# Patient Record
Sex: Male | Born: 1960 | Race: Black or African American | Hispanic: No | Marital: Single | Smoking: Former smoker
Health system: Southern US, Community
[De-identification: ages and names within clinical notes are randomized; demographics above are authoritative.]

## PROBLEM LIST (undated history)

## (undated) DIAGNOSIS — K219 Gastro-esophageal reflux disease without esophagitis: Secondary | ICD-10-CM

## (undated) DIAGNOSIS — I10 Essential (primary) hypertension: Secondary | ICD-10-CM

## (undated) DIAGNOSIS — E7800 Pure hypercholesterolemia, unspecified: Secondary | ICD-10-CM

---

## 2013-09-16 ENCOUNTER — Emergency Department (HOSPITAL_COMMUNITY): Payer: 59

## 2013-09-16 ENCOUNTER — Encounter (HOSPITAL_COMMUNITY): Payer: Self-pay | Admitting: Emergency Medicine

## 2013-09-16 ENCOUNTER — Emergency Department (HOSPITAL_COMMUNITY)
Admission: EM | Admit: 2013-09-16 | Discharge: 2013-09-16 | Disposition: A | Discharge status: Home / Self Care | Payer: 59 | Attending: Emergency Medicine | Admitting: Emergency Medicine

## 2013-09-16 DIAGNOSIS — Z8639 Personal history of other endocrine, nutritional and metabolic disease: Secondary | ICD-10-CM | POA: Insufficient documentation

## 2013-09-16 DIAGNOSIS — R42 Dizziness and giddiness: Secondary | ICD-10-CM | POA: Insufficient documentation

## 2013-09-16 DIAGNOSIS — Z87891 Personal history of nicotine dependence: Secondary | ICD-10-CM | POA: Insufficient documentation

## 2013-09-16 DIAGNOSIS — R209 Unspecified disturbances of skin sensation: Secondary | ICD-10-CM | POA: Insufficient documentation

## 2013-09-16 DIAGNOSIS — I10 Essential (primary) hypertension: Secondary | ICD-10-CM | POA: Insufficient documentation

## 2013-09-16 DIAGNOSIS — F411 Generalized anxiety disorder: Secondary | ICD-10-CM | POA: Insufficient documentation

## 2013-09-16 DIAGNOSIS — K219 Gastro-esophageal reflux disease without esophagitis: Secondary | ICD-10-CM | POA: Insufficient documentation

## 2013-09-16 DIAGNOSIS — Z79899 Other long term (current) drug therapy: Secondary | ICD-10-CM | POA: Insufficient documentation

## 2013-09-16 DIAGNOSIS — Z862 Personal history of diseases of the blood and blood-forming organs and certain disorders involving the immune mechanism: Secondary | ICD-10-CM | POA: Insufficient documentation

## 2013-09-16 DIAGNOSIS — R079 Chest pain, unspecified: Secondary | ICD-10-CM | POA: Insufficient documentation

## 2013-09-16 HISTORY — DX: Essential (primary) hypertension: I10

## 2013-09-16 HISTORY — DX: Pure hypercholesterolemia, unspecified: E78.00

## 2013-09-16 HISTORY — DX: Gastro-esophageal reflux disease without esophagitis: K21.9

## 2013-09-16 LAB — COMPREHENSIVE METABOLIC PANEL
ALBUMIN: 3.8 g/dL (ref 3.5–5.2)
ALT: 19 U/L (ref 0–53)
AST: 18 U/L (ref 0–37)
Alkaline Phosphatase: 76 U/L (ref 39–117)
BUN: 15 mg/dL (ref 6–23)
CO2: 22 mEq/L (ref 19–32)
CREATININE: 1.11 mg/dL (ref 0.50–1.35)
Calcium: 9.4 mg/dL (ref 8.4–10.5)
Chloride: 104 mEq/L (ref 96–112)
GFR calc Af Amer: 85 mL/min — ABNORMAL LOW (ref >90)
GFR calc non Af Amer: 74 mL/min — ABNORMAL LOW (ref >90)
Glucose, Bld: 107 mg/dL — ABNORMAL HIGH (ref 70–99)
Potassium: 4.3 mEq/L (ref 3.7–5.3)
Sodium: 139 mEq/L (ref 137–147)
TOTAL PROTEIN: 7 g/dL (ref 6.0–8.3)
Total Bilirubin: 0.4 mg/dL (ref 0.3–1.2)

## 2013-09-16 LAB — I-STAT TROPONIN, ED
TROPONIN I, POC: 0 ng/mL (ref 0.00–0.08)
TROPONIN I, POC: 0 ng/mL (ref 0.00–0.08)

## 2013-09-16 LAB — DIFFERENTIAL
BASOS ABS: 0 10*3/uL (ref 0.0–0.1)
BASOS PCT: 0 % (ref 0–1)
Eosinophils Absolute: 0.2 10*3/uL (ref 0.0–0.7)
Eosinophils Relative: 2 % (ref 0–5)
Lymphocytes Relative: 24 % (ref 12–46)
Lymphs Abs: 1.5 10*3/uL (ref 0.7–4.0)
MONO ABS: 0.9 10*3/uL (ref 0.1–1.0)
Monocytes Relative: 14 % — ABNORMAL HIGH (ref 3–12)
NEUTROS ABS: 3.8 10*3/uL (ref 1.7–7.7)
NEUTROS PCT: 60 % (ref 43–77)

## 2013-09-16 LAB — CBC
HCT: 42.9 % (ref 39.0–52.0)
HEMOGLOBIN: 14.8 g/dL (ref 13.0–17.0)
MCH: 29.4 pg (ref 26.0–34.0)
MCHC: 34.5 g/dL (ref 30.0–36.0)
MCV: 85.3 fL (ref 78.0–100.0)
Platelets: 265 10*3/uL (ref 150–400)
RBC: 5.03 MIL/uL (ref 4.22–5.81)
RDW: 12.9 % (ref 11.5–15.5)
WBC: 6.3 10*3/uL (ref 4.0–10.5)

## 2013-09-16 NOTE — ED Notes (Signed)
Reports having episodes of dizziness intermittent over the past week. Started with episodes of numbness to his left side of face and now will get intermittent numbness all the way down his left side. No neuro deficits noted at this time, denies dizziness but does feel mildly lightheaded. ekg done at triage, no neuro deficits noted at triage.

## 2013-09-16 NOTE — ED Notes (Signed)
PA at bedside.

## 2013-09-16 NOTE — Discharge Instructions (Signed)

## 2013-09-16 NOTE — ED Provider Notes (Signed)
CSN: [id number]     Arrival date & time 09/16/13  1019 History   First MD Initiated Contact with Patient 09/16/13 1248     Chief Complaint  Patient presents with  . Dizziness  . Numbness     (Consider location/radiation/quality/duration/timing/severity/associated sxs/prior Treatment) Patient is a 53 y.o. male presenting with dizziness. The history is provided by the patient.  Dizziness Quality:  Lightheadedness and imbalance Severity:  Moderate Onset quality:  Gradual Duration: intermittent dizziness for 2 weeks, associated with tingling to left face and body for one week. Timing:  Intermittent Progression:  Waxing and waning Chronicity:  New Context comment:  Stopped amlodipine 3 months ago.  Continuing atenolol and losartan. Relieved by: Rest. Worsened by:  Nothing tried Ineffective treatments:  None tried Associated symptoms: no blood in stool, no chest pain, no diarrhea, no headaches, no hearing loss, no nausea, no palpitations, no shortness of breath, no syncope, no tinnitus, no vision changes, no vomiting and no weakness   Associated symptoms comment:  Patient reports increased stress at work. Risk factors: no hx of stroke, no hx of vertigo and no new medications     Patient reports intermittent dizziness and left sided tingling.  Patient states the dizziness began 2 weeks ago and last week he began having left-sided tingling to his face and body that was associated with the dizziness. Patient states he had a similar episode earlier this week and began yesterday that resolved with rest. Today, patient states he was at work this morning developed similar symptoms with associated chest tightness. He states his symptoms this morning lasted approximately 30 minutes and resolved when he sat down. Patient was seen this morning by his company nurse and was advised to come to the emergency department for evaluation. Patient states that he has seen his primary care physician for the  dizziness at onset and was told that it may be related to sinus infections. She reports that his blood pressure has been elevated in the past, but the has been on 3 different medications, but stopped amlodipine 3 months ago do to making him feel "weak".   He denies headache, chest pain, shortness of breath, vomiting, or any symptoms at present.  Past Medical History  Diagnosis Date  . Hypertension   . Acid reflux   . High cholesterol    History reviewed. No pertinent past surgical history. History reviewed. No pertinent family history. History  Substance Use Topics  . Smoking status: Former Games developer  . Smokeless tobacco: Not on file  . Alcohol Use: Yes     Comment: occ    Review of Systems  Constitutional: Negative for activity change and appetite change.  HENT: Negative for hearing loss, tinnitus, trouble swallowing and voice change.   Eyes: Negative for photophobia, pain and visual disturbance.  Respiratory: Negative for shortness of breath.   Cardiovascular: Negative for chest pain, palpitations and syncope.  Gastrointestinal: Negative for nausea, vomiting, diarrhea and blood in stool.  Genitourinary: Negative for dysuria and frequency.  Musculoskeletal: Negative for arthralgias, neck pain and neck stiffness.  Skin: Negative for rash.  Neurological: Positive for dizziness, light-headedness and numbness. Negative for seizures, syncope, facial asymmetry, speech difficulty, weakness and headaches.  Hematological: Negative for adenopathy.  Psychiatric/Behavioral: Negative for confusion. The patient is nervous/anxious.   All other systems reviewed and are negative.     Allergies  Review of patient's allergies indicates no known allergies.  Home Medications   Prior to Admission medications   Medication Sig Start  Date End Date Taking? Authorizing Provider  allopurinol (ZYLOPRIM) 300 MG tablet Take 300 mg by mouth daily.  09/08/13  Yes Historical Provider, MD  atenolol (TENORMIN)  50 MG tablet Take 50 mg by mouth daily.  09/08/13  Yes Historical Provider, MD  fexofenadine (ALLEGRA) 180 MG tablet Take 180 mg by mouth daily.   Yes Historical Provider, MD  fluticasone (FLONASE) 50 MCG/ACT nasal spray Place 2 sprays into both nostrils daily as needed for allergies.   Yes Historical Provider, MD  losartan (COZAAR) 100 MG tablet Take 100 mg by mouth daily.  09/08/13  Yes Historical Provider, MD  RABEprazole (ACIPHEX) 20 MG tablet Take 20 mg by mouth daily.  09/08/13  Yes Historical Provider, MD   BP 182/93  Pulse 56  Temp(Src) 98.3 F (36.8 C) (Oral)  Resp 18  Ht 6\' 2"  (1.88 m)  Wt 215 lb (97.523 kg)  BMI 27.59 kg/m2  SpO2 95% Physical Exam  Nursing note and vitals reviewed. Constitutional: He is oriented to person, place, and time. He appears well-developed and well-nourished. No distress.  HENT:  Head: Normocephalic and atraumatic.  Mouth/Throat: Oropharynx is clear and moist.  Eyes: EOM are normal. Pupils are equal, round, and reactive to light.  Neck: Normal range of motion and phonation normal. Neck supple. No spinous process tenderness and no muscular tenderness present. No rigidity. No Brudzinski's sign and no Kernig's sign noted.  Cardiovascular: Normal rate, regular rhythm, normal heart sounds and intact distal pulses.   No murmur heard. Pulmonary/Chest: Effort normal and breath sounds normal. No respiratory distress. He exhibits no tenderness.  Abdominal: Soft. Bowel sounds are normal. He exhibits no distension. There is no tenderness. There is no rebound and no guarding.  Musculoskeletal: Normal range of motion.  Neurological: He is alert and oriented to person, place, and time. He has normal strength. He displays no tremor. No cranial nerve deficit or sensory deficit. He exhibits normal muscle tone. Coordination and gait normal. GCS eye subscore is 4. GCS verbal subscore is 5. GCS motor subscore is 6.  Reflex Scores:      Tricep reflexes are 2+ on the right  side and 2+ on the left side.      Bicep reflexes are 2+ on the right side and 2+ on the left side.      Patellar reflexes are 2+ on the right side and 2+ on the left side.      Achilles reflexes are 2+ on the right side and 2+ on the left side. Skin: Skin is warm and dry.  Psychiatric: He has a normal mood and affect.    ED Course  Procedures (including critical care time) Labs Review Labs Reviewed  DIFFERENTIAL - Abnormal; Notable for the following:    Monocytes Relative 14 (*)    All other components within normal limits  COMPREHENSIVE METABOLIC PANEL - Abnormal; Notable for the following:    Glucose, Bld 107 (*)    GFR calc non Af Amer 74 (*)    GFR calc Af Amer 85 (*)    All other components within normal limits  CBC  I-STAT TROPOININ, ED  Rosezena Sensor, ED    Imaging Review Dg Chest 2 View  09/16/2013   CLINICAL DATA:  Left chest pain.  EXAM: CHEST  2 VIEW  COMPARISON:  None.  FINDINGS: Heart size and mediastinal contours are within normal limits. Both lungs are clear. Visualized skeletal structures are unremarkable.  IMPRESSION: Negative exam.   Electronically Signed  By: Drusilla Kannerhomas  Dalessio M.D.   On: 09/16/2013 14:19   Ct Head Wo Contrast  09/16/2013   CLINICAL DATA:  Intermittent dizziness  EXAM: CT HEAD WITHOUT CONTRAST  TECHNIQUE: Contiguous axial images were obtained from the base of the skull through the vertex without intravenous contrast.  COMPARISON:  None.  FINDINGS: There is no evidence of mass effect, midline shift or extra-axial fluid collections. There is no evidence of a space-occupying lesion or intracranial hemorrhage. There is no evidence of a cortical-based area of acute infarction.  The ventricles and sulci are appropriate for the patient's age. The basal cisterns are patent.  Visualized portions of the orbits are unremarkable. Small mucous retention cyst in the right maxillary sinus.  The osseous structures are unremarkable.  IMPRESSION: Normal CT of the  brain without intravenous contrast.   Electronically Signed   By: Elige KoHetal  Patel   On: 09/16/2013 14:29     EKG Interpretation   Date/Time:  Wednesday September 16 2013 10:25:37 EDT Ventricular Rate:  56 PR Interval:  156 QRS Duration: 78 QT Interval:  402 QTC Calculation: 387 R Axis:   89 Text Interpretation:  Sinus bradycardia Septal infarct , age undetermined  Abnormal ECG No previous ECGs available Confirmed by ZACKOWSKI  MD, SCOTT  [phone]) on 09/16/2013 1:25:13 PM      MDM   Final diagnoses:  Dizziness, nonspecific     Labs and imaging have been reviewed by me. Patient was history, labs, imaging, and care plan was discussed with Dr. Deretha EmoryZackowski.  Patient remained stable and asymptomatic. No evidence of acute neurological process at this time. Patient's orthostatic vital signs were within normal limits. Patient does report that symptoms only seem to occur while he is at work, it is believed that some of the symptoms may be stress related although I am concerned that the patient's blood pressure seems to be fluctuating consistently despite regular medications. Patient agrees to take his medications daily as directed and to call his primary doctor to arrange a followup appointment. Patient seemed stable for discharge at this time, he was advised to return to the ER for any worsening symptoms.   Kierstan Auer L. Trisha Mangleriplett, PA-C 09/17/13 2137

## 2013-09-16 NOTE — ED Notes (Signed)
Phlebotomy made aware patient has returned from CT. Amber verbalized understanding.

## 2013-09-20 NOTE — ED Provider Notes (Signed)
Medical screening examination/treatment/procedure(s) were performed by non-physician practitioner and as supervising physician I was immediately available for consultation/collaboration.   EKG Interpretation   Date/Time:  Wednesday September 16 2013 10:25:37 EDT Ventricular Rate:  56 PR Interval:  156 QRS Duration: 78 QT Interval:  402 QTC Calculation: 387 R Axis:   89 Text Interpretation:  Sinus bradycardia Septal infarct , age undetermined  Abnormal ECG No previous ECGs available Confirmed by Abdiaziz Klahn  MD, Dominick Morella  [phone]) on 09/16/2013 1:25:13 PM        Vanetta Mulders, MD 09/20/13 1144

## 2015-04-06 ENCOUNTER — Encounter (HOSPITAL_COMMUNITY): Payer: Self-pay | Admitting: *Deleted

## 2015-04-06 ENCOUNTER — Emergency Department (HOSPITAL_COMMUNITY)
Admission: EM | Admit: 2015-04-06 | Discharge: 2015-04-06 | Disposition: A | Discharge status: Home / Self Care | Payer: Self-pay | Attending: Emergency Medicine | Admitting: Emergency Medicine

## 2015-04-06 DIAGNOSIS — Z79899 Other long term (current) drug therapy: Secondary | ICD-10-CM | POA: Insufficient documentation

## 2015-04-06 DIAGNOSIS — R630 Anorexia: Secondary | ICD-10-CM | POA: Insufficient documentation

## 2015-04-06 DIAGNOSIS — R55 Syncope and collapse: Secondary | ICD-10-CM | POA: Insufficient documentation

## 2015-04-06 DIAGNOSIS — K219 Gastro-esophageal reflux disease without esophagitis: Secondary | ICD-10-CM | POA: Insufficient documentation

## 2015-04-06 DIAGNOSIS — I10 Essential (primary) hypertension: Secondary | ICD-10-CM | POA: Insufficient documentation

## 2015-04-06 DIAGNOSIS — Z8639 Personal history of other endocrine, nutritional and metabolic disease: Secondary | ICD-10-CM | POA: Insufficient documentation

## 2015-04-06 DIAGNOSIS — Z87891 Personal history of nicotine dependence: Secondary | ICD-10-CM | POA: Insufficient documentation

## 2015-04-06 LAB — CBC
HCT: 43.6 % (ref 39.0–52.0)
Hemoglobin: 14.5 g/dL (ref 13.0–17.0)
MCH: 29.8 pg (ref 26.0–34.0)
MCHC: 33.3 g/dL (ref 30.0–36.0)
MCV: 89.7 fL (ref 78.0–100.0)
Platelets: 247 10*3/uL (ref 150–400)
RBC: 4.86 MIL/uL (ref 4.22–5.81)
RDW: 12.7 % (ref 11.5–15.5)
WBC: 11.9 10*3/uL — ABNORMAL HIGH (ref 4.0–10.5)

## 2015-04-06 LAB — BASIC METABOLIC PANEL
ANION GAP: 8 (ref 5–15)
BUN: 10 mg/dL (ref 6–20)
CO2: 28 mmol/L (ref 22–32)
Calcium: 8.9 mg/dL (ref 8.9–10.3)
Chloride: 101 mmol/L (ref 101–111)
Creatinine, Ser: 1.3 mg/dL — ABNORMAL HIGH (ref 0.61–1.24)
GFR calc Af Amer: >60 mL/min (ref >60)
GFR calc non Af Amer: 60 mL/min — ABNORMAL LOW (ref >60)
GLUCOSE: 111 mg/dL — AB (ref 65–99)
Potassium: 4.2 mmol/L (ref 3.5–5.1)
SODIUM: 137 mmol/L (ref 135–145)

## 2015-04-06 LAB — CBG MONITORING, ED: Glucose-Capillary: 101 mg/dL — ABNORMAL HIGH (ref 65–99)

## 2015-04-06 NOTE — ED Notes (Signed)
Pt arrives via [facility], had a syncopal episode while getting his hair cut. LOC for about 1 min. Upon EMS arrival pt A/O x4 but c/o of dizziness. Pt denies N/V but states that he has had diarrhea x 1 day 2 days ago and has suffered from upper respiratory infection for the past few days and took some Alka Seltzer this AM.

## 2015-04-06 NOTE — Discharge Instructions (Signed)
Take tylenol 2 pills 4 times a day and motrin 4 pills 3 times a day.  Drink plenty of fluids.  Return for worsening shortness of breath, headache, confusion. Follow up with your family doctor.   Syncope Syncope is a medical term for fainting or passing out. This means you lose consciousness and drop to the ground. People are generally unconscious for less than 5 minutes. You may have some muscle twitches for up to 15 seconds before waking up and returning to normal. Syncope occurs more often in older adults, but it can happen to anyone. While most causes of syncope are not dangerous, syncope can be a sign of a serious medical problem. It is important to seek medical care.  CAUSES  Syncope is caused by a sudden drop in blood flow to the brain. The specific cause is often not determined. Factors that can bring on syncope include:  Taking medicines that lower blood pressure.  Sudden changes in posture, such as standing up quickly.  Taking more medicine than prescribed.  Standing in one place for too long.  Seizure disorders.  Dehydration and excessive exposure to heat.  Low blood sugar (hypoglycemia).  Straining to have a bowel movement.  Heart disease, irregular heartbeat, or other circulatory problems.  Fear, emotional distress, seeing blood, or severe pain. SYMPTOMS  Right before fainting, you may:  Feel dizzy or light-headed.  Feel nauseous.  See all white or all black in your field of vision.  Have cold, clammy skin. DIAGNOSIS  Your health care provider will ask about your symptoms, perform a physical exam, and perform an electrocardiogram (ECG) to record the electrical activity of your heart. Your health care provider may also perform other heart or blood tests to determine the cause of your syncope which may include:  Transthoracic echocardiogram (TTE). During echocardiography, sound waves are used to evaluate how blood flows through your heart.  Transesophageal  echocardiogram (TEE).  Cardiac monitoring. This allows your health care provider to monitor your heart rate and rhythm in real time.  Holter monitor. This is a portable device that records your heartbeat and can help diagnose heart arrhythmias. It allows your health care provider to track your heart activity for several days, if needed.  Stress tests by exercise or by giving medicine that makes the heart beat faster. TREATMENT  In most cases, no treatment is needed. Depending on the cause of your syncope, your health care provider may recommend changing or stopping some of your medicines. HOME CARE INSTRUCTIONS  Have someone stay with you until you feel stable.  Do not drive, use machinery, or play sports until your health care provider says it is okay.  Keep all follow-up appointments as directed by your health care provider.  Lie down right away if you start feeling like you might faint. Breathe deeply and steadily. Wait until all the symptoms have passed.  Drink enough fluids to keep your urine clear or pale yellow.  If you are taking blood pressure or heart medicine, get up slowly and take several minutes to sit and then stand. This can reduce dizziness. SEEK IMMEDIATE MEDICAL CARE IF:   You have a severe headache.  You have unusual pain in the chest, abdomen, or back.  You are bleeding from your mouth or rectum, or you have black or tarry stool.  You have an irregular or very fast heartbeat.  You have pain with breathing.  You have repeated fainting or seizure-like jerking during an episode.  You faint  when sitting or lying down.  You have confusion.  You have trouble walking.  You have severe weakness.  You have vision problems. If you fainted, call your local emergency services (911 in [place] Do not drive yourself to the hospital.    This information is not intended to replace advice given to you by your health care provider. Make sure you discuss any questions  you have with your health care provider.   Document Released: 04/02/2005 Document Revised: 08/17/2014 Document Reviewed: 06/01/2011 Elsevier Interactive Patient Education [employer]

## 2015-04-06 NOTE — ED Notes (Signed)
Pt denies hitting his head during syncopal episode.

## 2015-04-06 NOTE — ED Provider Notes (Signed)
CSN: [id number]     Arrival date & time 04/06/15  1139 History   First MD Initiated Contact with Patient 04/06/15 1156     Chief Complaint  Patient presents with  . Loss of Consciousness     (Consider location/radiation/quality/duration/timing/severity/associated sxs/prior Treatment) Patient is a 54 y.o. male presenting with syncope. The history is provided by the patient.  Loss of Consciousness Episode history:  Single Most recent episode:  Today Duration:  1 minute Timing:  Rare Progression:  Resolved Chronicity:  New Context comment:  Hair cut Witnessed: yes   Relieved by:  Nothing Worsened by:  Nothing tried Ineffective treatments:  None tried Associated symptoms: no anxiety, no chest pain, no confusion, no diaphoresis, no difficulty breathing, no dizziness, no fever, no headaches, no nausea, no palpitations, no shortness of breath and no vomiting   Risk factors: no congenital heart disease and no coronary artery disease     54 year old male with a chief complaint of a syncopal episode. This happened while he is getting his haircut earlier today. Patient thinks it lasted for about a minute. CT felt like the world was closing in on him and then he passed out. Denies chest pain shortness of breath headache neck pain. Patient has had a URI for the past 3 or 4 days and has had decreased oral intake. Patient had minimal to eat or drink today. Denies fevers or chills denies shortness of breath. Denies family history of sudden cardiac death denies history of heart failure. Denies lower extremity swelling.  Past Medical History  Diagnosis Date  . Hypertension   . Acid reflux   . High cholesterol    History reviewed. No pertinent past surgical history. No family history on file. Social History  Substance Use Topics  . Smoking status: Former Games developer  . Smokeless tobacco: None  . Alcohol Use: Yes     Comment: occ    Review of Systems  Constitutional: Negative for fever, chills  and diaphoresis.  HENT: Negative for congestion and facial swelling.   Eyes: Negative for discharge and visual disturbance.  Respiratory: Negative for shortness of breath.   Cardiovascular: Positive for syncope. Negative for chest pain and palpitations.  Gastrointestinal: Negative for nausea, vomiting, abdominal pain and diarrhea.  Musculoskeletal: Negative for myalgias and arthralgias.  Skin: Negative for color change and rash.  Neurological: Positive for syncope. Negative for dizziness, tremors and headaches.  Psychiatric/Behavioral: Negative for confusion and dysphoric mood.      Allergies  Review of patient's allergies indicates no known allergies.  Home Medications   Prior to Admission medications   Medication Sig Start Date End Date Taking? Authorizing Provider  allopurinol (ZYLOPRIM) 300 MG tablet Take 300 mg by mouth daily.  09/08/13   Historical Provider, MD  atenolol (TENORMIN) 50 MG tablet Take 50 mg by mouth daily.  09/08/13   Historical Provider, MD  fexofenadine (ALLEGRA) 180 MG tablet Take 180 mg by mouth daily.    Historical Provider, MD  fluticasone (FLONASE) 50 MCG/ACT nasal spray Place 2 sprays into both nostrils daily as needed for allergies.    Historical Provider, MD  losartan (COZAAR) 100 MG tablet Take 100 mg by mouth daily.  09/08/13   Historical Provider, MD  RABEprazole (ACIPHEX) 20 MG tablet Take 20 mg by mouth daily.  09/08/13   Historical Provider, MD   BP 118/88 mmHg  Pulse 82  Temp(Src) 98.3 F (36.8 C) (Oral)  Resp 27  SpO2 96% Physical Exam  Constitutional: He is  oriented to person, place, and time. He appears well-developed and well-nourished.  HENT:  Head: Normocephalic and atraumatic.  Eyes: EOM are normal. Pupils are equal, round, and reactive to light.  Neck: Normal range of motion. Neck supple. No JVD present.  Cardiovascular: Normal rate and regular rhythm.  Exam reveals no gallop and no friction rub.   No murmur heard. Pulmonary/Chest:  No respiratory distress. He has no wheezes.  Abdominal: He exhibits no distension. There is no tenderness. There is no rebound and no guarding.  Musculoskeletal: Normal range of motion.  Neurological: He is alert and oriented to person, place, and time.  Skin: No rash noted. No pallor.  Psychiatric: He has a normal mood and affect. His behavior is normal.  Nursing note and vitals reviewed.   ED Course  Procedures (including critical care time) Labs Review Labs Reviewed  BASIC METABOLIC PANEL - Abnormal; Notable for the following:    Glucose, Bld 111 (*)    Creatinine, Ser 1.30 (*)    GFR calc non Af Amer 60 (*)    All other components within normal limits  CBC - Abnormal; Notable for the following:    WBC 11.9 (*)    All other components within normal limits  CBG MONITORING, ED - Abnormal; Notable for the following:    Glucose-Capillary 101 (*)    All other components within normal limits  URINALYSIS, ROUTINE W REFLEX MICROSCOPIC (NOT AT [facility])  CBG MONITORING, ED    Imaging Review No results found. I have personally reviewed and evaluated these images and lab results as part of my medical decision-making.   EKG Interpretation   Date/Time:  Wednesday April 06 2015 11:41:33 EST Ventricular Rate:  86 PR Interval:  146 QRS Duration: 97 QT Interval:  355 QTC Calculation: 425 R Axis:   93 Text Interpretation:  Sinus rhythm Borderline right axis deviation new  isolated flipped t wave in lead III no wpw, prolonged qt, wpw Confirmed by  Brayley Mackowiak MD, DANIEL [id number]) on 04/06/2015 11:56:14 AM      MDM   Final diagnoses:  Syncope and collapse    54 yo M with a chief complaint of a syncopal event. Most likely vagal by history. EKG unremarkable. CBG normal. Patient is currently asymptomatic. Will treat the patient has a URI. Have him increase his fluid and caloric intake for the next couple days. PCP follow-up.  1:25 PM:  I have discussed the diagnosis/risks/treatment options  with the patient and family and believe the pt to be eligible for discharge home to follow-up with PCP. We also discussed returning to the ED immediately if new or worsening sx occur. We discussed the sx which are most concerning (e.g., sudden worsening pain, fever, inability to tolerate by mouth) that necessitate immediate return. Medications administered to the patient during their visit and any new prescriptions provided to the patient are listed below.  Medications given during this visit Medications - No data to display  Discharge Medication List as of 04/06/2015 12:03 PM      The patient appears reasonably screen and/or stabilized for discharge and I doubt any other medical condition or other [facility] requiring further screening, evaluation, or treatment in the ED at this time prior to discharge.    Melene Planan Jakaiya Netherland, DO 04/06/15 1325

## 2015-04-06 NOTE — ED Notes (Signed)
Pt cbg 101 

## 2015-09-06 IMAGING — CT CT HEAD W/O CM
1 series · 16 of 30 positions shown, 20 images · non-contrast
Comparison: None.

CLINICAL DATA: Intermittent dizziness

EXAM:
CT HEAD WITHOUT CONTRAST
TECHNIQUE: Contiguous axial images were obtained from the base of the skull
through the vertex without intravenous contrast.

[Series 2: head 5.0 h30s · axial · 0.46mm/px · z∈[-174,-29]mm · 16 of 33 slices shown, 20 images]
[im 2/33  brain]
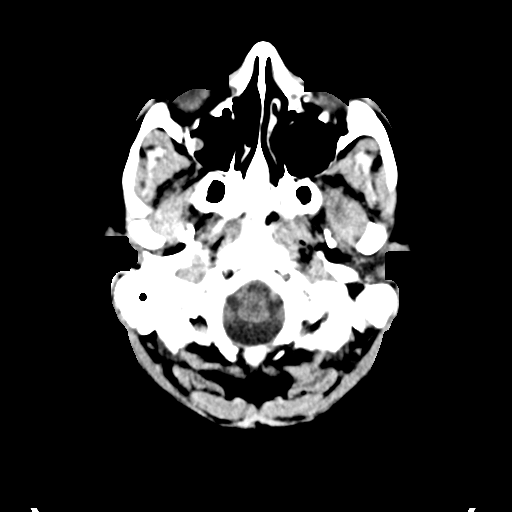
[im 2/33  bone]
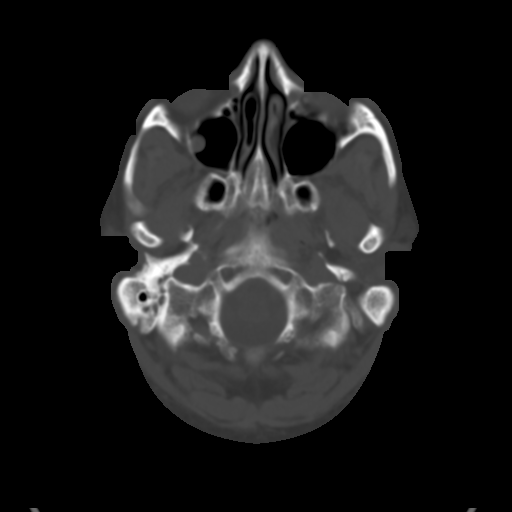
[im 4/33  brain]
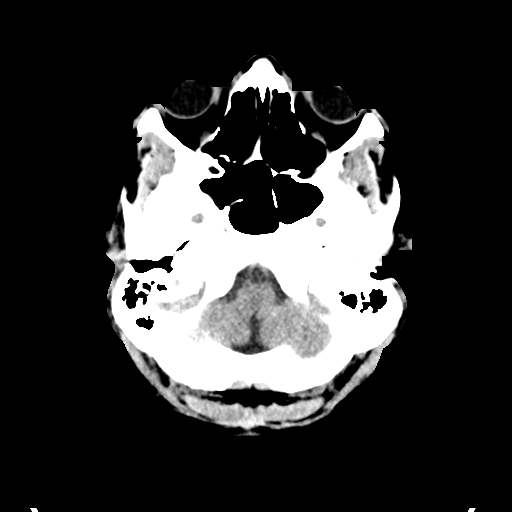
[im 6/33  brain]
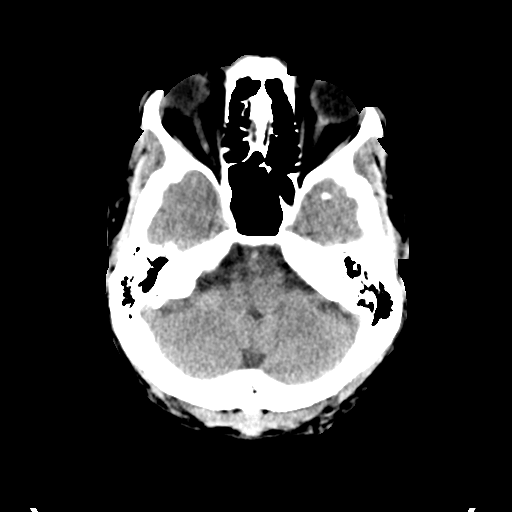
[im 8/33  brain]
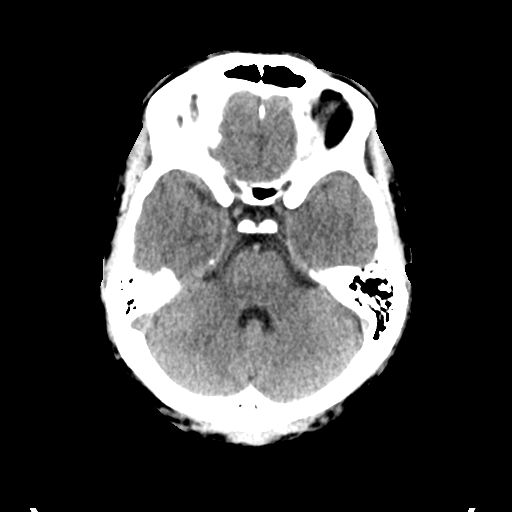
[im 9/33  brain]
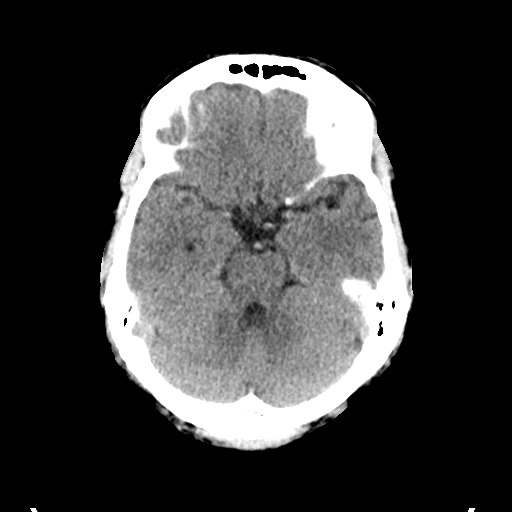
[im 9/33  bone]
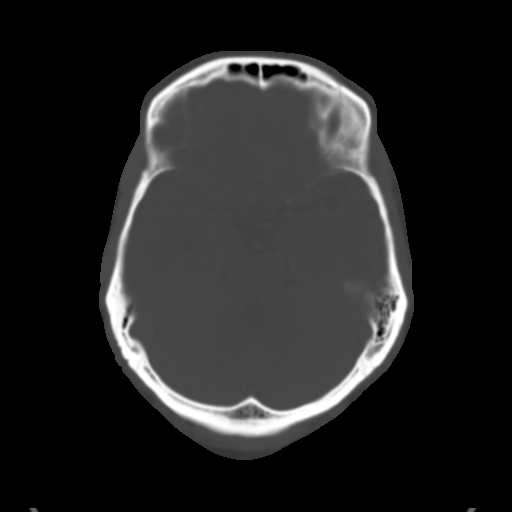
[im 12/33  brain]
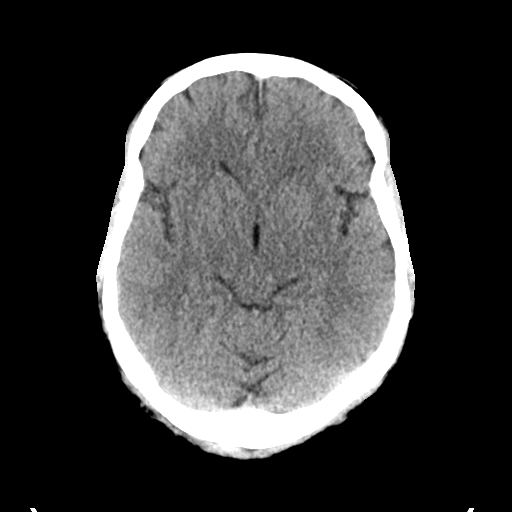
[im 14/33  brain]
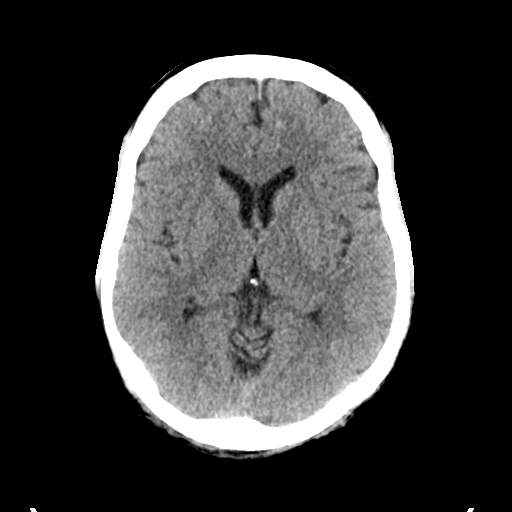
[im 16/33  brain]
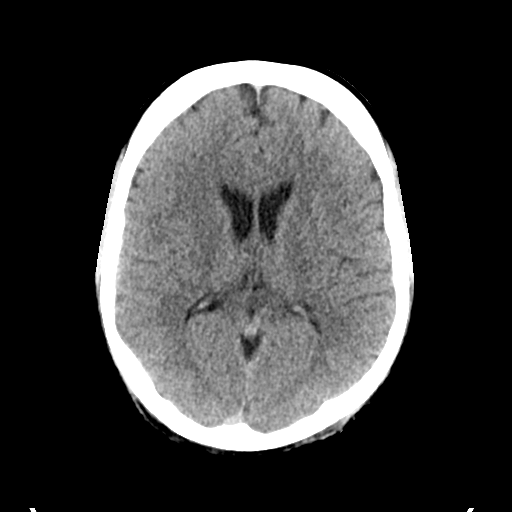
[im 17/33  brain]
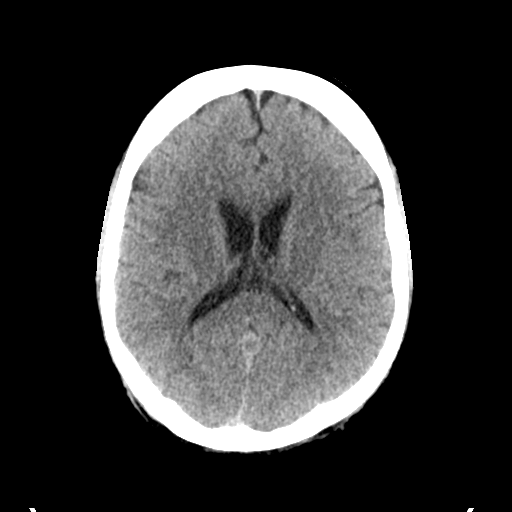
[im 17/33  bone]
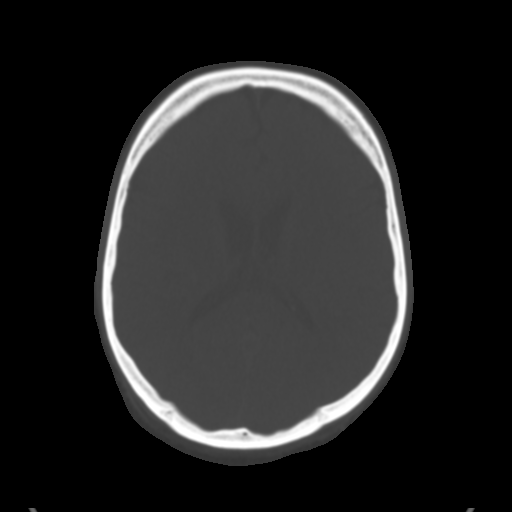
[im 19/33  brain]
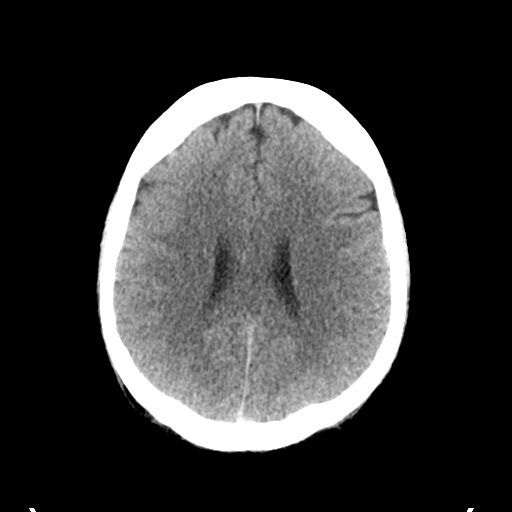
[im 21/33  brain]
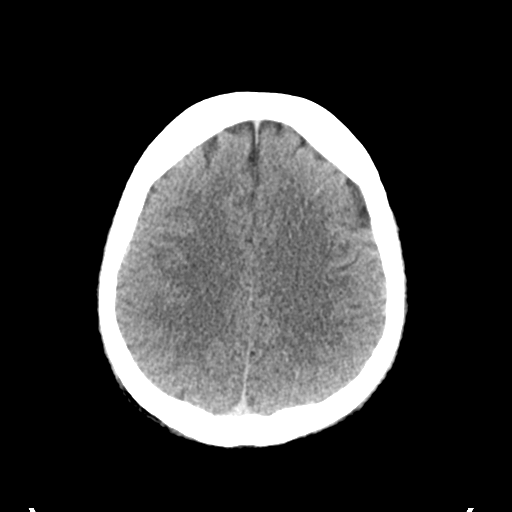
[im 24/33  brain]
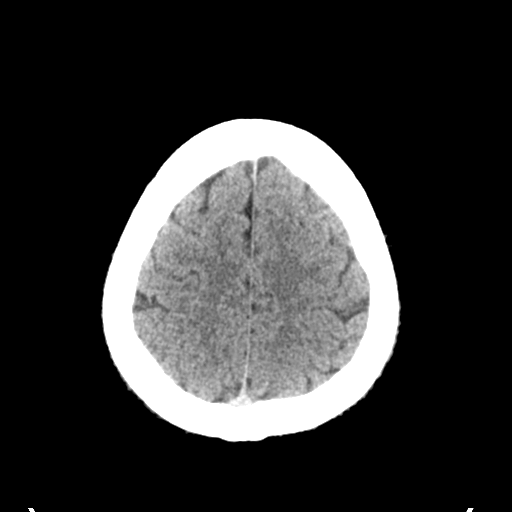
[im 25/33  brain]
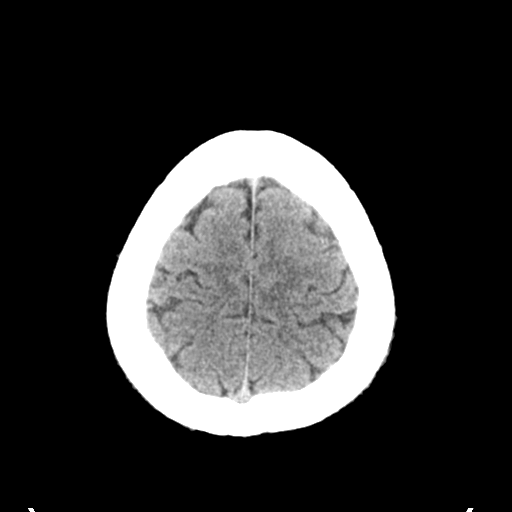
[im 25/33  bone]
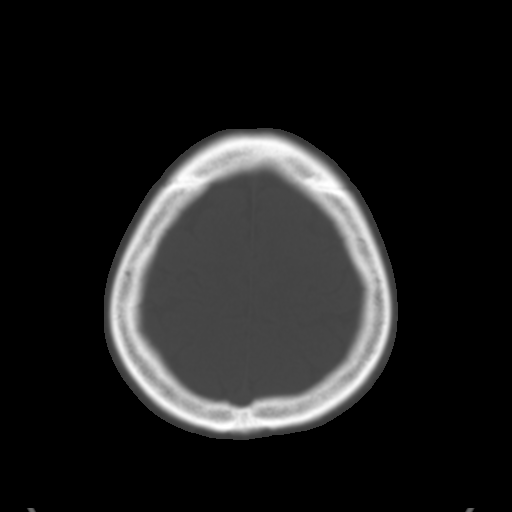
[im 27/33  brain]
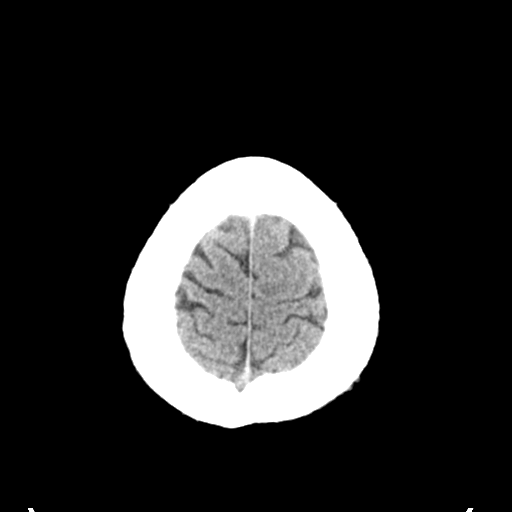
[im 29/33  brain]
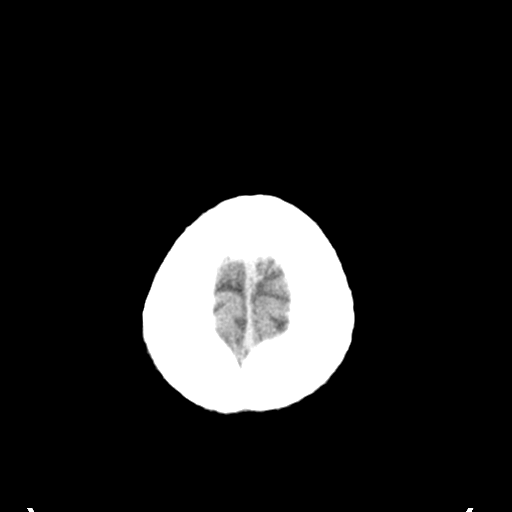
[im 31/33  brain]
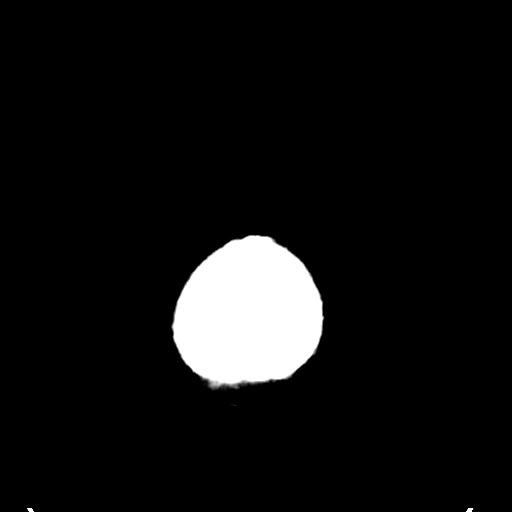

[16 of 30 positions shown; findings below may reference images not displayed]

FINDINGS: There is no evidence of mass effect, midline shift or extra-axial
fluid collections. There is no evidence of a space-occupying lesion
or intracranial hemorrhage. There is no evidence of a cortical-based
area of acute infarction.

The ventricles and sulci are appropriate for the patient's age. The
basal cisterns are patent.

Visualized portions of the orbits are unremarkable. Small mucous
retention cyst in the right maxillary sinus.

The osseous structures are unremarkable.
IMPRESSION: Normal CT of the brain without intravenous contrast.

## 2019-06-27 ENCOUNTER — Ambulatory Visit: Payer: Self-pay | Attending: Internal Medicine

## 2019-06-27 ENCOUNTER — Other Ambulatory Visit: Payer: Self-pay

## 2019-06-27 DIAGNOSIS — Z23 Encounter for immunization: Secondary | ICD-10-CM

## 2019-06-27 NOTE — Progress Notes (Signed)
   Covid-19 Vaccination Clinic  Name:  Roy Murphy    MRN: [id number] DOB: May 09, 1960  06/27/2019  [name] was observed post Covid-19 immunization for 15 minutes without incident. He was provided with Vaccine Information Sheet and instruction to access the V-Safe system.   [name] was instructed to call [phone] with any severe reactions post vaccine: Marland Kitchen Difficulty breathing  . Swelling of face and throat  . A fast heartbeat  . A bad rash all over body  . Dizziness and weakness   Immunizations Administered    Name Date Dose VIS Date Route   Pfizer COVID-19 Vaccine 06/27/2019 12:29 PM 0.3 mL 03/27/2019 Intramuscular   Manufacturer: [employer], [employer]   Lot: [id number]   NDC: [address]-7

## 2019-07-22 ENCOUNTER — Ambulatory Visit: Payer: Self-pay | Attending: Internal Medicine

## 2019-07-22 DIAGNOSIS — Z23 Encounter for immunization: Secondary | ICD-10-CM

## 2019-07-22 NOTE — Progress Notes (Signed)
   [facility] Vaccination [facility]  Name:  Roy Murphy    MRN: [id number] DOB: 1960-09-01  07/22/2019  [name] was observed post Covid-19 immunization for 15 minutes without incident. He was provided with Vaccine Information Sheet and instruction to access the V-Safe system.   [name] was instructed to call 911 with any severe reactions post vaccine: Marland Kitchen Difficulty breathing  . Swelling of face and throat  . A fast heartbeat  . A bad rash all over body  . Dizziness and weakness   Immunizations Administered    Name Date Dose VIS Date Route   Pfizer COVID-19 Vaccine 07/22/2019  4:19 PM 0.3 mL 03/27/2019 Intramuscular   Manufacturer: [employer], [employer]   Lot: [id number]   NDC: [address]-0
